# Patient Record
Sex: Male | Born: 1995 | Race: White | Hispanic: No | Marital: Single | Smoking: Never smoker
Health system: Southern US, Community
[De-identification: ages and names within clinical notes are randomized; demographics above are authoritative.]

---

## 2018-05-24 ENCOUNTER — Encounter (HOSPITAL_COMMUNITY): Payer: Self-pay | Admitting: Emergency Medicine

## 2018-05-24 ENCOUNTER — Emergency Department (HOSPITAL_COMMUNITY)

## 2018-05-24 ENCOUNTER — Emergency Department (HOSPITAL_COMMUNITY)
Admission: EM | Admit: 2018-05-24 | Discharge: 2018-05-24 | Disposition: A | Discharge status: Home / Self Care | Attending: Emergency Medicine | Admitting: Emergency Medicine

## 2018-05-24 DIAGNOSIS — Y999 Unspecified external cause status: Secondary | ICD-10-CM | POA: Diagnosis not present

## 2018-05-24 DIAGNOSIS — Y9368 Activity, volleyball (beach) (court): Secondary | ICD-10-CM | POA: Insufficient documentation

## 2018-05-24 DIAGNOSIS — X500XXA Overexertion from strenuous movement or load, initial encounter: Secondary | ICD-10-CM | POA: Diagnosis not present

## 2018-05-24 DIAGNOSIS — M24411 Recurrent dislocation, right shoulder: Secondary | ICD-10-CM | POA: Insufficient documentation

## 2018-05-24 DIAGNOSIS — Y929 Unspecified place or not applicable: Secondary | ICD-10-CM | POA: Diagnosis not present

## 2018-05-24 DIAGNOSIS — S43004A Unspecified dislocation of right shoulder joint, initial encounter: Secondary | ICD-10-CM

## 2018-05-24 DIAGNOSIS — S4991XA Unspecified injury of right shoulder and upper arm, initial encounter: Secondary | ICD-10-CM | POA: Diagnosis present

## 2018-05-24 MED ORDER — IBUPROFEN 800 MG PO TABS
800.0000 mg | ORAL_TABLET | Freq: Once | ORAL | Status: AC
  Administered 2018-05-24: 800 mg via ORAL

## 2018-05-24 MED ORDER — SHOULDER BRACE LARGE MISC: 0 refills | Status: AC

## 2018-05-24 MED ORDER — IBUPROFEN 800 MG PO TABS
ORAL_TABLET | ORAL | Status: AC
  Filled 2018-05-24: qty 1

## 2018-05-24 NOTE — ED Triage Notes (Signed)
Patient is inmate states he was playing volleyball and dislocated his right shoulder.

## 2018-05-24 NOTE — ED Provider Notes (Signed)
[facility] Provider Note   CSN: [id number] Arrival date & time: 05/24/18  1901     History   Chief Complaint Chief Complaint  Patient presents with  . Shoulder Injury    HPI Jacques NavyZachary XXXMatthews is a 22 y.o. male.  HPI Patient presents with acute onset right shoulder pain, following a awkward motion while playing volleyball. Patient has a history of recurrent dislocations of the shoulder. Since the event today, he has had sharp persistent pain, particularly with any attempted moving the arm. Notably, the patient is incarcerated, was playing volleyball in the penitentiary. He is accompanied by multiple sheriffs. He denies other injuries, states that he is otherwise generally well. He has not received medication since the event.  History reviewed. No pertinent past medical history.  There are no active problems to display for this patient.   History reviewed. No pertinent surgical history.      Home Medications    Prior to Admission medications   Not on File    Family History History reviewed. No pertinent family history.  Social History Social History   Tobacco Use  . Smoking status: Never Smoker  . Smokeless tobacco: Never Used  Substance Use Topics  . Alcohol use: Not Currently    Frequency: Never  . Drug use: Never     Allergies   Patient has no known allergies.   Review of Systems Review of Systems  Constitutional:       Per HPI, otherwise negative  HENT:       Per HPI, otherwise negative  Respiratory:       Per HPI, otherwise negative  Cardiovascular:       Per HPI, otherwise negative  Gastrointestinal: Negative for vomiting.  Endocrine:       Negative aside from HPI  Genitourinary:       Neg aside from HPI   Musculoskeletal:       Per HPI, otherwise negative  Skin: Negative.   Neurological: Negative for syncope.     Physical Exam Updated Vital Signs Ht 6\' 3"  (1.905 m)   Wt 101.2 kg (223 lb)   BMI 27.87  kg/m   Physical Exam  Constitutional: He is oriented to person, place, and time. He appears well-developed. No distress.  HENT:  Head: Normocephalic and atraumatic.  Eyes: Conjunctivae and EOM are normal.  Cardiovascular: Normal rate and regular rhythm.  Pulmonary/Chest: Effort normal. No stridor. No respiratory distress.  Abdominal: He exhibits no distension.  Musculoskeletal: He exhibits no edema.       Left shoulder: Normal.       Right elbow: Normal.      Arms: Neurological: He is alert and oriented to person, place, and time.  Skin: Skin is warm and dry.  Psychiatric: He has a normal mood and affect.  Nursing note and vitals reviewed.    ED Treatments / Results   Radiology Dg Shoulder Right  Result Date: 05/24/2018 CLINICAL DATA:  Larey SeatFell playing volleyball. EXAM: RIGHT SHOULDER - 2+ VIEW COMPARISON:  None. FINDINGS: RIGHT anterior-inferior dislocation. No fracture deformity. No destructive bony lesions. Small metallic foreign body projects over mid chest on single view, external to the patient. No destructive bony lesions. Soft tissue planes are non suspicious. IMPRESSION: RIGHT anterior inferior shoulder dislocation. No fracture deformity. Electronically Signed   By: Awilda Metroourtnay  Bloomer M.D.   On: 05/24/2018 19:35    Repeat x-ray reviewed by me, and the patient's room, shoulder is appropriately reduced, patient notes that he  feels substantially better. We discussed the importance of following up with orthopedist, and immobilization, with support device to make a recurrence less likely.  Procedures .Ortho Injury Treatment Date/Time: 05/24/2018 8:12 PM Performed by: Gerhard Munch, MD Authorized by: Gerhard Munch, MD   Consent:    Consent obtained:  Verbal   Consent given by:  Patient   Risks discussed:  Irreducible dislocation and fracture   Alternatives discussed:  No treatment and alternative treatmentInjury location: shoulder Location details: right  shoulder Injury type: dislocation Dislocation type: anterior Hill-Sachs deformity: no Chronicity: recurrent Pre-procedure neurovascular assessment: neurovascularly intact Pre-procedure distal perfusion: normal Pre-procedure neurological function: normal Pre-procedure range of motion: reduced  Anesthesia: Local anesthesia used: no  Patient sedated: NoManipulation performed: yes Reduction method: scapular manipulation and external rotation Reduction successful: yes X-ray confirmed reduction: yes Immobilization: sling Post-procedure neurovascular assessment: post-procedure neurovascularly intact Post-procedure distal perfusion: normal Post-procedure neurological function: normal Post-procedure range of motion: improved Patient tolerance: Patient tolerated the procedure well with no immediate complications    (including critical care time)    Initial Impression / Assessment and Plan / ED Course  I have reviewed the triage vital signs and the nursing notes.  Pertinent labs & imaging results that were available during my care of the patient were reviewed by me and considered in my medical decision making (see chart for details).  Young male presents after sustaining a shoulder dislocation. Patient is a history of prior similar events. Patient was distally neurovascularly intact, and with manipulation of the scapula, as well as the upper arm, with traction and external rotation the shoulder was reduced in the emergency department without complication. Patient discharged in a sling, with prescription for splint immobilization.  Final Clinical Impressions(s) / ED Diagnoses   Final diagnoses:  Dislocation of right shoulder joint, initial encounter     Gerhard Munch, MD 05/24/18 2015

## 2019-11-08 IMAGING — CR DG SHOULDER 2+V PORT*R*
2 series · 2 of 2 positions shown · non-contrast
Comparison: Pre reduction radiographs

CLINICAL DATA: Post reduction of right shoulder dislocation.

EXAM:
PORTABLE RIGHT SHOULDER

[scapula y]
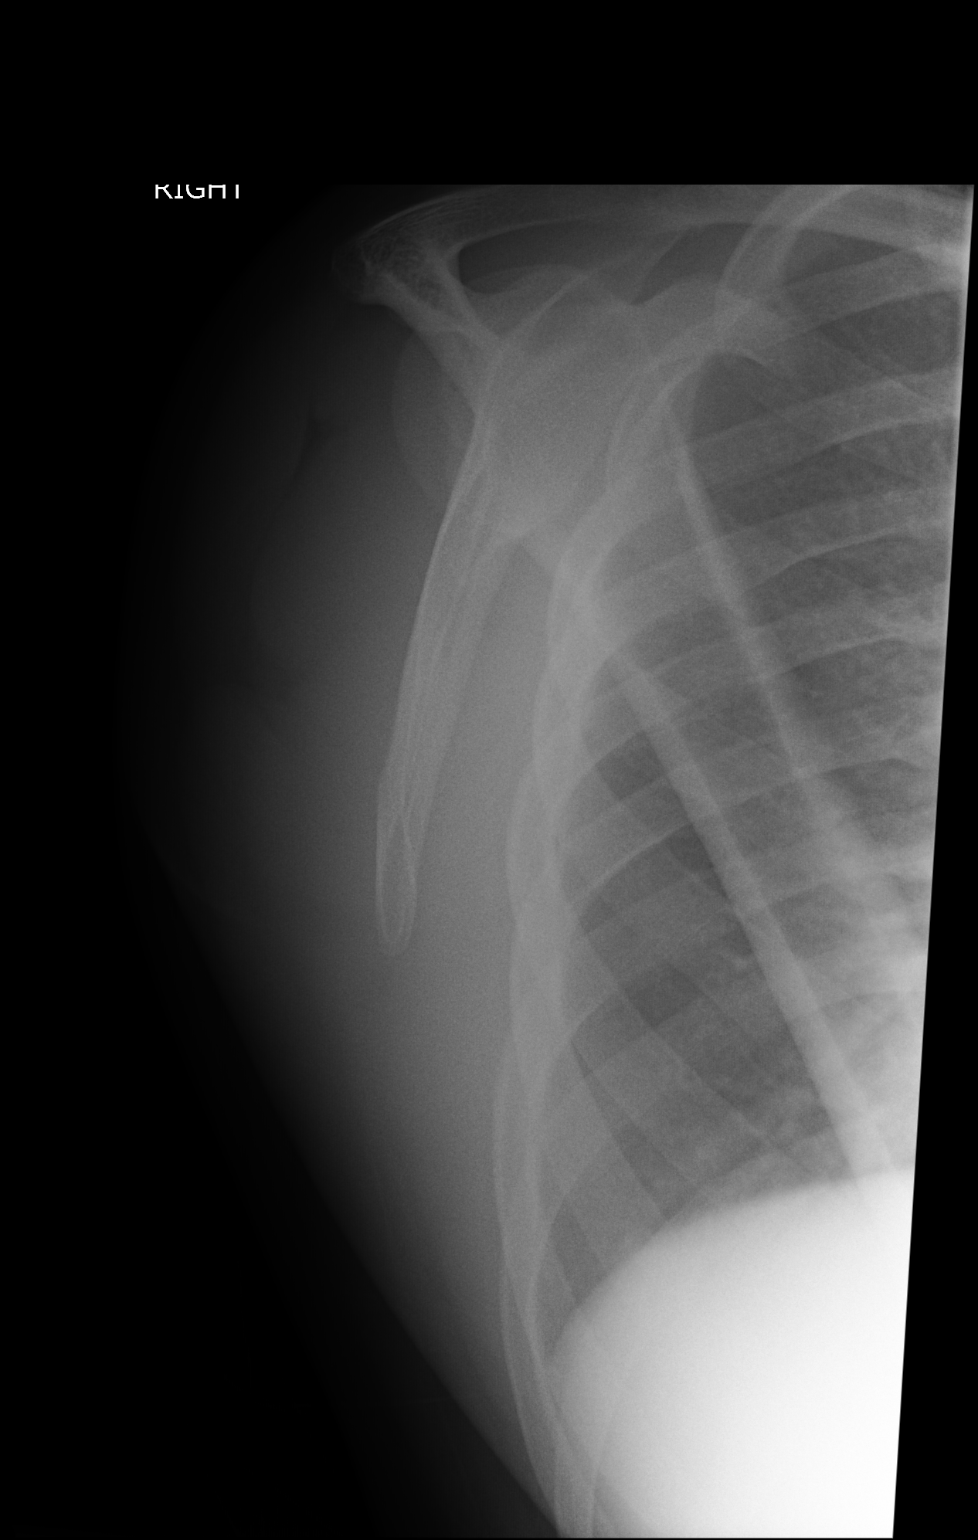

[grashey]
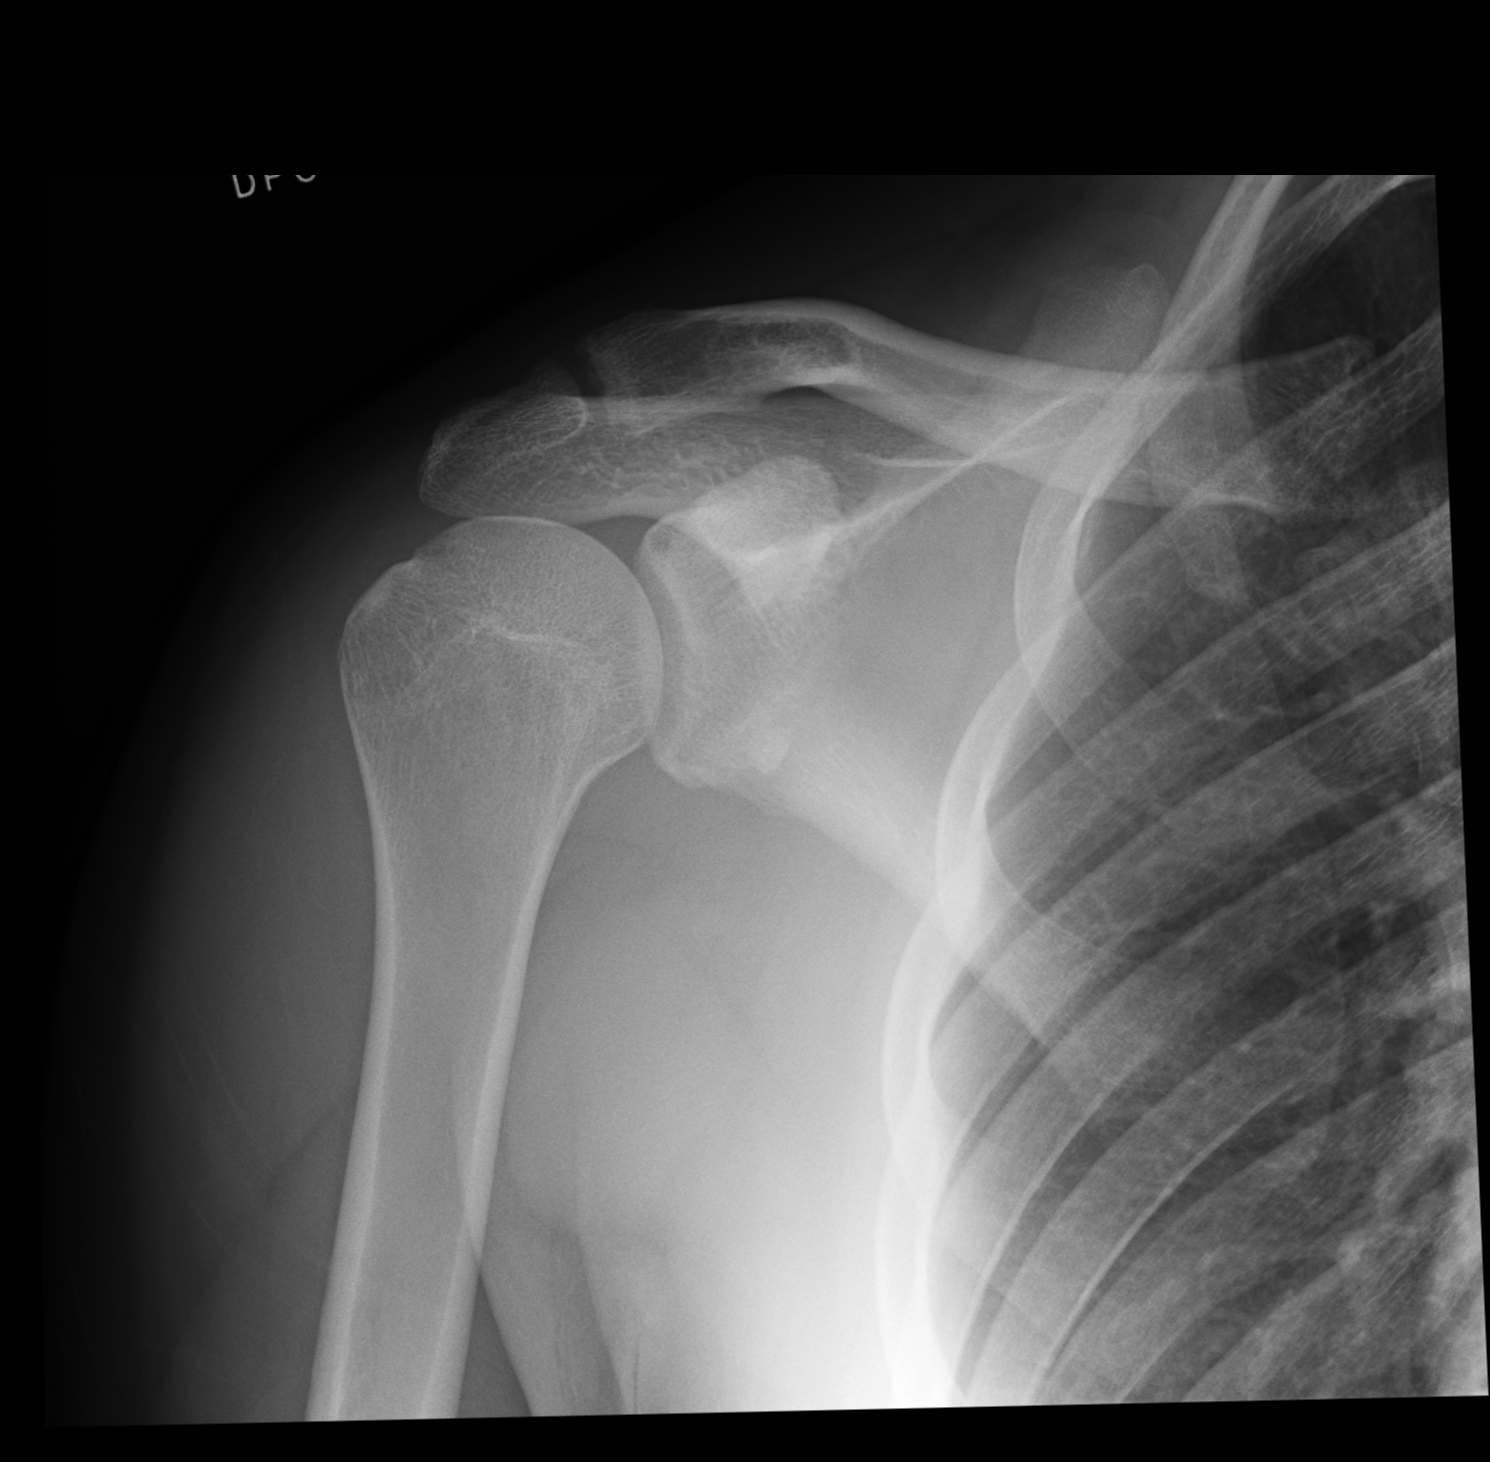

[2 of 2 positions shown; findings below may reference images not displayed]

FINDINGS: Satisfactory reduction of the right humeral head which is now in
anatomic alignment with the glenoid. There is an ovoid 8 mm density
adjacent to the neck of the glenoid possibly representing small
loose body or bone island. A donor site is not apparent. No definite
Hill-Sachs or bony Bankart lesion identified.
IMPRESSION: Satisfactory reduction of humeral head.

Ovoid 8 mm density projects over the neck of the glenoid on the AP
without definite donor site. Question intra-articular loose body or
bone island.
